# Patient Record
Sex: Female | Born: 1970 | Race: Black or African American | Hispanic: No | Marital: Single | Smoking: Never smoker
Health system: Southern US, Community
[De-identification: ages and names within clinical notes are randomized; demographics above are authoritative.]

## PROBLEM LIST (undated history)

## (undated) DIAGNOSIS — F32A Depression, unspecified: Secondary | ICD-10-CM

## (undated) HISTORY — DX: Depression, unspecified: F32.A

## (undated) HISTORY — PX: TUBAL LIGATION: SHX77

---

## 2013-03-28 ENCOUNTER — Emergency Department (HOSPITAL_COMMUNITY)
Admission: EM | Admit: 2013-03-28 | Discharge: 2013-03-29 | Disposition: A | Discharge status: Home / Self Care | Payer: BC Managed Care – PPO | Attending: Emergency Medicine | Admitting: Emergency Medicine

## 2013-03-28 ENCOUNTER — Encounter (HOSPITAL_COMMUNITY): Payer: Self-pay | Admitting: *Deleted

## 2013-03-28 ENCOUNTER — Emergency Department (HOSPITAL_COMMUNITY): Payer: BC Managed Care – PPO

## 2013-03-28 DIAGNOSIS — Y9389 Activity, other specified: Secondary | ICD-10-CM | POA: Insufficient documentation

## 2013-03-28 DIAGNOSIS — Y92410 Unspecified street and highway as the place of occurrence of the external cause: Secondary | ICD-10-CM | POA: Insufficient documentation

## 2013-03-28 DIAGNOSIS — S40019A Contusion of unspecified shoulder, initial encounter: Secondary | ICD-10-CM | POA: Insufficient documentation

## 2013-03-28 DIAGNOSIS — S161XXA Strain of muscle, fascia and tendon at neck level, initial encounter: Secondary | ICD-10-CM

## 2013-03-28 DIAGNOSIS — S40012A Contusion of left shoulder, initial encounter: Secondary | ICD-10-CM

## 2013-03-28 DIAGNOSIS — S139XXA Sprain of joints and ligaments of unspecified parts of neck, initial encounter: Secondary | ICD-10-CM | POA: Insufficient documentation

## 2013-03-28 DIAGNOSIS — Z79899 Other long term (current) drug therapy: Secondary | ICD-10-CM | POA: Insufficient documentation

## 2013-03-28 MED ORDER — HYDROMORPHONE HCL PF 1 MG/ML IJ SOLN
1.0000 mg | Freq: Once | INTRAMUSCULAR | Status: AC
  Administered 2013-03-28: 1 mg via INTRAMUSCULAR
  Filled 2013-03-28: qty 1

## 2013-03-28 MED ORDER — DIAZEPAM 5 MG PO TABS: 5.0000 mg | ORAL_TABLET | Freq: Four times a day (QID) | ORAL | Status: AC | PRN

## 2013-03-28 MED ORDER — IBUPROFEN 600 MG PO TABS: 600.0000 mg | ORAL_TABLET | Freq: Four times a day (QID) | ORAL | Status: AC | PRN

## 2013-03-28 MED ORDER — OXYCODONE-ACETAMINOPHEN 5-325 MG PO TABS: 1.0000 | ORAL_TABLET | Freq: Four times a day (QID) | ORAL | Status: AC | PRN

## 2013-03-28 NOTE — ED Notes (Signed)
Call from radiology stating when patient stood up for x-rays, she was dizzy. Advised that patient had dilaudid injection approximately 40 minutes ago. MD aware.
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
## 2022-01-21 DIAGNOSIS — M1990 Unspecified osteoarthritis, unspecified site: Secondary | ICD-10-CM | POA: Insufficient documentation

## 2022-01-21 DIAGNOSIS — F32A Depression, unspecified: Secondary | ICD-10-CM | POA: Insufficient documentation

## 2022-10-04 ENCOUNTER — Ambulatory Visit: Payer: No Typology Code available for payment source | Attending: Internal Medicine | Admitting: Internal Medicine

## 2022-10-04 ENCOUNTER — Encounter: Payer: Self-pay | Admitting: Internal Medicine

## 2022-10-04 VITALS — BP 117/74 | HR 67 | Resp 16 | Ht 64.0 in | Wt 174.6 lb

## 2022-10-04 DIAGNOSIS — R768 Other specified abnormal immunological findings in serum: Secondary | ICD-10-CM | POA: Diagnosis not present

## 2022-10-04 DIAGNOSIS — M25531 Pain in right wrist: Secondary | ICD-10-CM | POA: Diagnosis not present

## 2022-10-04 NOTE — Progress Notes (Signed)
Office Visit Note  Patient: Stacy Huynh             Date of Birth: 05/01/1971           MRN: [id number]             PCP: Patient, No Pcp Per Referring: Sharyon Medicus, PA-C Visit Date: 10/04/2022 Occupation: Dance movement psychotherapist  Subjective:  Pain and Numbness of the Right Hand (Right hand pain x 3 years thumb and knuckles worsening. ) and New Patient (Initial Visit)   History of Present Illness: Stacy Huynh is a 51 y.o. female here for evaluation of positive ANA with bilateral hand pains right worse than left inflammation. She was treated with medrol dosepak with improvement and had injection for right dequervains tenosynovitis and has taken oral NSAIDs and pain medications. Nothing has been particularly effective in resolving her thumb and wrist pain. Pain in the other knuckles has been better recently. She is left handed but does not notice issues on her left hand at all. She does have pain and stiffness affecting both shoulders most of the time as well. She has some discoloration around the side of the wrist where these problems are not sure if it is related to her inflammation or the medications. Otherwise no new skin rashes. She denies new ulcers, lymphadenopathy, raynaud's, or abnormal bruising or bleeding.  Labs reviewed 06/2022 ANA 1: 160 speckled RF neg CCP neg ESR 17 CRP 6.1  Xray bilateral hands unremarkable   Review of Systems  Constitutional:  Positive for fatigue.  HENT: Negative.  Negative for mouth sores and mouth dryness.   Eyes: Negative.  Negative for dryness.  Respiratory: Negative.  Negative for shortness of breath.   Cardiovascular:  Positive for palpitations. Negative for chest pain.  Gastrointestinal: Negative.  Negative for blood in stool, constipation and diarrhea.  Endocrine: Negative.  Negative for increased urination.  Genitourinary: Negative.  Negative for involuntary urination.  Musculoskeletal:  Positive for joint pain, joint pain,  joint swelling, myalgias, muscle weakness, morning stiffness, muscle tenderness and myalgias. Negative for gait problem.  Skin:  Positive for color change. Negative for rash, hair loss and sensitivity to sunlight.  Allergic/Immunologic: Negative.  Negative for susceptible to infections.  Neurological:  Positive for dizziness and headaches.  Hematological: Negative.  Negative for swollen glands.  Psychiatric/Behavioral:  Positive for depressed mood and sleep disturbance. The patient is nervous/anxious.     PMFS History:  Patient Active Problem List   Diagnosis Date Noted   Pain in right wrist 10/04/2022   Positive ANA (antinuclear antibody) 10/04/2022   Depressive disorder 01/21/2022   Arthritis 01/21/2022    Past Medical History:  Diagnosis Date   Depression     Family History  Problem Relation Age of Onset   Rheumatologic disease Mother    Arthritis/Rheumatoid Mother    Dementia Mother    Liver disease Mother    Hypertension Mother    Cancer Father    Hypertension Father    Liver disease Father    Rheumatologic disease Sister    Hypertension Brother    Hypertension Brother    Hypertension Brother    Past Surgical History:  Procedure Laterality Date   TUBAL LIGATION     Social History   Social History Narrative   Not on file    There is no immunization history on file for this patient.   Objective: Vital Signs: BP 117/74 (BP Location: Right Arm, Patient Position: Sitting, Cuff Size: Large)  Pulse 67   Resp 16   Ht _0  (1.626 m)   Wt 174 lb 9.6 oz (79.2 kg)   LMP 09/17/2022   BMI 29.97 kg/m    Physical Exam Eyes:     Conjunctiva/sclera: Conjunctivae normal.  Cardiovascular:     Rate and Rhythm: Normal rate and regular rhythm.  Pulmonary:     Effort: Pulmonary effort is normal.     Breath sounds: Normal breath sounds.  Musculoskeletal:     Right lower leg: No edema.     Left lower leg: No edema.  Skin:    Findings: No rash.  Neurological:      Mental Status: She is alert.     Motor: No weakness.  Psychiatric:        Mood and Affect: Mood normal.      Musculoskeletal Exam:  Neck full ROM no tenderness Shoulders full ROM tenderness to pressure anterior and lateral side and muscle groups above scapula Elbows full ROM no tenderness or swelling Right wrist tenderness to pressure on radial side positive finkelstein's test and to pressure on 1st CMC, without palpable swelling and ROM is normal Fingers full ROM no tenderness or swelling Knees full ROM no tenderness or swelling   Investigation: No additional findings.  Imaging: No results found.  Recent Labs: No results found for: "WBC", "HGB", "PLT", "NA", "K", "CL", "CO2", "GLUCOSE", "BUN", "CREATININE", "BILITOT", "ALKPHOS", "AST", "ALT", "PROT", "ALBUMIN", "CALCIUM", "GFRAA", "QFTBGOLD", "QFTBGOLDPLUS"  Speciality Comments: No specialty comments available.  Procedures:  No procedures performed Allergies: Patient has no known allergies.   Assessment / Plan:     Visit Diagnoses: Positive ANA (antinuclear antibody) - Plan: Anti-scleroderma antibody, RNP Antibody, Anti-Smith antibody, Sjogrens syndrome-A extractable nuclear antibody, Sjogrens syndrome-B extractable nuclear antibody, Anti-DNA antibody, double-stranded, C3 and C4  Positive ANA but no systemic symptom complaints very concerning for ongoing connective tissue disease.  Does have some shoulder pain for symptoms outside of the thumb issue but appears to be more muscular than articular problem.  Checking more specific antibody panel as above and serum complements.  I have a low pretest suspicion for disease activity so would not recommend empiric medication trial unless new clinical findings arise.  Pain in right wrist - Plan: C-reactive protein  Right wrist pain is highly consistent with dequervains tenosynovitis or possibly more related to first [facility] joint.  Rechecking the previously mild elevated CRP I do not see  any other appreciable joint inflammation to account for this..  However considering 3 years of symptoms of varying severity and inadequate response to multiple local treatments I think she may benefit with additional imaging including musculoskeletal ultrasound and/or MRI.  Orders: Orders Placed This Encounter  Procedures   Anti-scleroderma antibody   RNP Antibody   Anti-Smith antibody   Sjogrens syndrome-A extractable nuclear antibody   Sjogrens syndrome-B extractable nuclear antibody   Anti-DNA antibody, double-stranded   C3 and C4   C-reactive protein   No orders of the defined types were placed in this encounter.    Follow-Up Instructions: Return in about 4 weeks (around 11/01/2022) for New pt +ANA/wrist pain f/u 67mo   CCollier Salina MD  Note - This record has been created using [employer]  Chart creation errors have been sought, but may not always  have been located. Such creation errors do not reflect on  the standard of medical care.

## 2022-10-05 LAB — C-REACTIVE PROTEIN: CRP: 5.9 mg/L (ref <8.0)

## 2022-10-06 LAB — ANTI-SCLERODERMA ANTIBODY: Scleroderma (Scl-70) (ENA) Antibody, IgG: <1 AI

## 2022-10-06 LAB — SJOGRENS SYNDROME-B EXTRACTABLE NUCLEAR ANTIBODY: SSB (La) (ENA) Antibody, IgG: <1 AI

## 2022-10-06 LAB — ANTI-DNA ANTIBODY, DOUBLE-STRANDED: ds DNA Ab: <1 IU/mL

## 2022-10-06 LAB — C3 AND C4
C3 Complement: 170 mg/dL (ref 83–193)
C4 Complement: 39 mg/dL (ref 15–57)

## 2022-10-06 LAB — SJOGRENS SYNDROME-A EXTRACTABLE NUCLEAR ANTIBODY: SSA (Ro) (ENA) Antibody, IgG: <1 AI

## 2022-10-06 LAB — RNP ANTIBODY: Ribonucleic Protein(ENA) Antibody, IgG: <1 AI

## 2022-10-06 LAB — ANTI-SMITH ANTIBODY: ENA SM Ab Ser-aCnc: <1 AI

## 2022-11-06 NOTE — Progress Notes (Deleted)
Office Visit Note  Patient: Stacy Huynh             Date of Birth: 09/10/1971           MRN: [id number]             PCP: Patient, No Pcp Per Referring: No ref. provider found Visit Date: 11/07/2022   Subjective:  No chief complaint on file.   History of Present Illness: Stacy Huynh is a 52 y.o. female here for follow up ***   Previous HPI 10/04/22 Stacy Huynh is a 52 y.o. female here for evaluation of positive ANA with bilateral hand pains right worse than left inflammation. She was treated with medrol dosepak with improvement and had injection for right dequervains tenosynovitis and has taken oral NSAIDs and pain medications. Nothing has been particularly effective in resolving her thumb and wrist pain. Pain in the other knuckles has been better recently. She is left handed but does not notice issues on her left hand at all. She does have pain and stiffness affecting both shoulders most of the time as well. She has some discoloration around the side of the wrist where these problems are not sure if it is related to her inflammation or the medications. Otherwise no new skin rashes. She denies new ulcers, lymphadenopathy, raynaud's, or abnormal bruising or bleeding.   Labs reviewed 06/2022 ANA 1: 160 speckled RF neg CCP neg ESR 17 CRP 6.1   Xray bilateral hands unremarkable   No Rheumatology ROS completed.   PMFS History:  Patient Active Problem List   Diagnosis Date Noted   Pain in right wrist 10/04/2022   Positive ANA (antinuclear antibody) 10/04/2022   Depressive disorder 01/21/2022   Arthritis 01/21/2022    Past Medical History:  Diagnosis Date   Depression     Family History  Problem Relation Age of Onset   Rheumatologic disease Mother    Arthritis/Rheumatoid Mother    Dementia Mother    Liver disease Mother    Hypertension Mother    Cancer Father    Hypertension Father    Liver disease Father    Rheumatologic disease Sister     Hypertension Brother    Hypertension Brother    Hypertension Brother    Past Surgical History:  Procedure Laterality Date   TUBAL LIGATION     Social History   Social History Narrative   Not on file    There is no immunization history on file for this patient.   Objective: Vital Signs: There were no vitals taken for this visit.   Physical Exam   Musculoskeletal Exam: ***  CDAI Exam: CDAI Score: -- Patient Global: --; Provider Global: -- Swollen: --; Tender: -- Joint Exam 11/07/2022   No joint exam has been documented for this visit   There is currently no information documented on the homunculus. Go to the Rheumatology activity and complete the homunculus joint exam.  Investigation: No additional findings.  Imaging: No results found.  Recent Labs: No results found for: "WBC", "HGB", "PLT", "NA", "K", "CL", "CO2", "GLUCOSE", "BUN", "CREATININE", "BILITOT", "ALKPHOS", "AST", "ALT", "PROT", "ALBUMIN", "CALCIUM", "GFRAA", "QFTBGOLD", "QFTBGOLDPLUS"  Speciality Comments: No specialty comments available.  Procedures:  No procedures performed Allergies: Patient has no known allergies.   Assessment / Plan:     Visit Diagnoses: No diagnosis found.  ***  Orders: No orders of the defined types were placed in this encounter.  No orders of the defined types were placed in this encounter.  Follow-Up Instructions: No follow-ups on file.   Collier Salina, MD  Note - This record has been created using [employer].  Chart creation errors have been sought, but may not always  have been located. Such creation errors do not reflect on  the standard of medical care.

## 2022-11-07 ENCOUNTER — Ambulatory Visit: Payer: Self-pay | Admitting: Internal Medicine
